# Patient Record
Sex: Male | Born: 1970 | Race: White | Hispanic: No | Smoking: Current every day smoker
Health system: Southern US, Community
[De-identification: ages and names within clinical notes are randomized; demographics above are authoritative.]

---

## 2005-11-03 ENCOUNTER — Emergency Department (HOSPITAL_COMMUNITY): Admission: EM | Admit: 2005-11-03 | Discharge: 2005-11-03 | Payer: Self-pay | Admitting: Emergency Medicine

## 2006-02-05 ENCOUNTER — Emergency Department (HOSPITAL_COMMUNITY): Admission: EM | Admit: 2006-02-05 | Discharge: 2006-02-05 | Payer: Self-pay | Admitting: Certified Registered"

## 2006-06-06 ENCOUNTER — Emergency Department (HOSPITAL_COMMUNITY): Admission: EM | Admit: 2006-06-06 | Discharge: 2006-06-06 | Payer: Self-pay | Admitting: Emergency Medicine

## 2010-09-13 ENCOUNTER — Emergency Department (HOSPITAL_COMMUNITY): Payer: Worker's Compensation

## 2010-09-13 ENCOUNTER — Emergency Department (HOSPITAL_COMMUNITY)
Admission: EM | Admit: 2010-09-13 | Discharge: 2010-09-13 | Disposition: A | Discharge status: Home / Self Care | Payer: Worker's Compensation | Attending: Emergency Medicine | Admitting: Emergency Medicine

## 2010-09-13 DIAGNOSIS — S6000XA Contusion of unspecified finger without damage to nail, initial encounter: Secondary | ICD-10-CM | POA: Insufficient documentation

## 2010-09-13 DIAGNOSIS — W19XXXA Unspecified fall, initial encounter: Secondary | ICD-10-CM | POA: Insufficient documentation

## 2010-09-13 DIAGNOSIS — M7989 Other specified soft tissue disorders: Secondary | ICD-10-CM | POA: Insufficient documentation

## 2010-09-13 DIAGNOSIS — M79609 Pain in unspecified limb: Secondary | ICD-10-CM | POA: Insufficient documentation

## 2010-09-13 DIAGNOSIS — IMO0002 Reserved for concepts with insufficient information to code with codable children: Secondary | ICD-10-CM | POA: Insufficient documentation

## 2010-09-13 DIAGNOSIS — F17200 Nicotine dependence, unspecified, uncomplicated: Secondary | ICD-10-CM | POA: Insufficient documentation

## 2010-09-13 DIAGNOSIS — S63639A Sprain of interphalangeal joint of unspecified finger, initial encounter: Secondary | ICD-10-CM | POA: Insufficient documentation

## 2011-06-25 ENCOUNTER — Encounter (HOSPITAL_COMMUNITY): Payer: Self-pay | Admitting: Emergency Medicine

## 2011-06-25 ENCOUNTER — Emergency Department (HOSPITAL_COMMUNITY)
Admission: EM | Admit: 2011-06-25 | Discharge: 2011-06-25 | Disposition: A | Discharge status: Home / Self Care | Payer: Self-pay | Attending: Emergency Medicine | Admitting: Emergency Medicine

## 2011-06-25 DIAGNOSIS — L0291 Cutaneous abscess, unspecified: Secondary | ICD-10-CM

## 2011-06-25 DIAGNOSIS — L0390 Cellulitis, unspecified: Secondary | ICD-10-CM

## 2011-06-25 DIAGNOSIS — L0231 Cutaneous abscess of buttock: Secondary | ICD-10-CM | POA: Insufficient documentation

## 2011-06-25 MED ORDER — OXYCODONE-ACETAMINOPHEN 5-325 MG PO TABS: 2.0000 | ORAL_TABLET | ORAL | Status: AC | PRN

## 2011-06-25 MED ORDER — SULFAMETHOXAZOLE-TRIMETHOPRIM 800-160 MG PO TABS: 1.0000 | ORAL_TABLET | Freq: Two times a day (BID) | ORAL | Status: AC

## 2011-06-25 MED ORDER — LIDOCAINE HCL 2 % IJ SOLN
10.0000 mL | Freq: Once | INTRAMUSCULAR | Status: AC
  Administered 2011-06-25: 20 mg via INTRADERMAL
  Filled 2011-06-25: qty 1

## 2011-06-25 MED ORDER — IBUPROFEN 800 MG PO TABS: 800.0000 mg | ORAL_TABLET | Freq: Three times a day (TID) | ORAL | Status: AC

## 2011-06-25 NOTE — ED Notes (Signed)
Pt reports swollen "boil" on l/buttock. Area painful with pressure

## 2011-06-25 NOTE — ED Provider Notes (Signed)
History     CSN: [id number]  Arrival date & time 06/25/11  1117   First MD Initiated Contact with Patient 06/25/11 1143      Chief Complaint  Patient presents with  . Sore    Absess on r/buttock. Painful x 2 days.    (Consider location/radiation/quality/duration/timing/severity/associated sxs/prior treatment) Patient is a 41 y.o. male presenting with abscess. The history is provided by the patient and a relative. No language interpreter was used.  Abscess  This is a new problem. The current episode started less than one week ago. The onset was gradual. The problem occurs occasionally. The problem has been rapidly worsening. The abscess is present on the left buttock. The problem is moderate. The abscess is characterized by redness, painfulness, draining and swelling. Pertinent negatives include no fever and no vomiting.  Reports intermittant abscess to L buttocks that has never been drained.  3cm in size with erythema and fluctuance.  No fever nausea or vomiting.   History reviewed. No pertinent past medical history.  History reviewed. No pertinent past surgical history.  Family History  Problem Relation Age of Onset  . Diabetes Mother   . Hypertension Mother     History  Substance Use Topics  . Smoking status: Current Everyday Smoker    Types: Cigarettes  . Smokeless tobacco: Not on file  . Alcohol Use: No      Review of Systems  Constitutional: Negative.  Negative for fever and diaphoresis.  HENT: Negative.   Eyes: Negative.   Respiratory: Negative.  Negative for shortness of breath.   Cardiovascular: Negative.   Gastrointestinal: Negative.  Negative for nausea and vomiting.  Skin:       L buttock tenderness with abscess  Neurological: Negative.  Negative for dizziness, weakness and light-headedness.  Psychiatric/Behavioral: Negative.   All other systems reviewed and are negative.    Allergies  Review of patient's allergies indicates no known  allergies.  Home Medications   Current Outpatient Rx  Name Route Sig Dispense Refill  . IBUPROFEN 800 MG PO TABS Oral Take 1 tablet (800 mg total) by mouth 3 (three) times daily. 21 tablet 0  . OXYCODONE-ACETAMINOPHEN 5-325 MG PO TABS Oral Take 2 tablets by mouth every 4 (four) hours as needed for pain. 15 tablet 0  . SULFAMETHOXAZOLE-TRIMETHOPRIM 800-160 MG PO TABS Oral Take 1 tablet by mouth 2 (two) times daily. 20 tablet 0    BP 112/89  Pulse 76  Temp(Src) 98.6 F (37 C) (Oral)  Resp 18  SpO2 98%  Physical Exam  Constitutional: He is oriented to person, place, and time. He appears well-developed and well-nourished. No distress.  HENT:  Head: Normocephalic.  Eyes: Pupils are equal, round, and reactive to light.  Neck: Normal range of motion.  Cardiovascular: Normal rate.   Pulmonary/Chest: Effort normal.  Neurological: He is alert and oriented to person, place, and time.  Skin: Skin is warm and dry.       L buttock 3cm abscess with cellulitis.  Tenderness erythema     ED Course  INCISION AND DRAINAGE Date/Time: 06/25/2011 12:16 PM Performed by: Remi Haggard Authorized by: Remi Haggard Consent: Verbal consent obtained. Written consent not obtained. Risks and benefits: risks, benefits and alternatives were discussed Consent given by: patient Patient understanding: patient states understanding of the procedure being performed Patient identity confirmed: verbally with patient, arm band, provided demographic data and hospital-assigned identification number Time out: Immediately prior to procedure a "time out" was called to verify the  correct patient, procedure, equipment, support staff and site/side marked as required. Type: abscess Anesthesia: local infiltration Local anesthetic: lidocaine 2% without epinephrine Anesthetic total: 6 ml Patient sedated: no Scalpel size: 11 Needle gauge: 20 Incision type: single straight Drainage: purulent and serosanguinous Drainage  amount: moderate Wound treatment: wound left open Packing material: 1/4 in iodoform gauze Patient tolerance: Patient tolerated the procedure well with no immediate complications.   (including critical care time)  Labs Reviewed - No data to display No results found.   1. Abscess   2. Cellulitis       MDM  Abscess/cellulitis  I & D to L buttocks. Return for packing removal and recheck in 46 hours.  Bactrim and percocet rx.          Remi Haggard, NP 06/25/11 2018

## 2011-06-25 NOTE — Discharge Instructions (Signed)
Ethan Rogers we drained an abscess on your L buttock today.  Start the antibiotics today.  Return in 48 hours for a recheck.  Use hot compresses 3 times a day and squeeze.  Return sooner for severe  Pain or high fever. Take ibuprofen 800mg  every 6 hours with food for pain.  For severe pain take percocet 1-2 tabs every 4 hours.   Abscess An abscess (boil or furuncle) is an infected area under your skin. This area is filled with yellowish white fluid (pus). HOME CARE   Only take medicine as told by your doctor.   Keep the skin clean around your abscess. Keep clothes that may touch the abscess clean.   Change any bandages (dressings) as told by your doctor.   Avoid direct skin contact with other people. The infection can spread by skin contact with others.   Practice good hygiene and do not share personal care items.   Do not share athletic equipment, towels, or whirlpools. Shower after every practice or work out session.   If a draining area cannot be covered:   Do not play sports.   Children should not go to daycare until the wound has healed or until fluid (drainage) stops coming out of the wound.   See your doctor for a follow-up visit as told.  GET HELP RIGHT AWAY IF:   There is more pain, puffiness (swelling), and redness in the wound site.   There is fluid or bleeding from the wound site.   You have muscle aches, chills, fever, or feel sick.   You or your child has a temperature by mouth above 102 F (38.9 C), not controlled by medicine.   Your baby is older than 3 months with a rectal temperature of 102 F (38.9 C) or higher.  MAKE SURE YOU:   Understand these instructions.   Will watch your condition.   Will get help right away if you are not doing well or get worse.  Document Released: 07/11/2007 Document Revised: 01/11/2011 Document Reviewed: 07/11/2007 [facility] Patient Information 2012 [place], [place].Cellulitis Cellulitis is an infection of the tissue under the  skin. The infected area is usually red and tender. This is caused by germs. These germs enter the body through cuts or sores. This usually happens in the arms or lower legs. HOME CARE   Take your medicine as told. Finish it even if you start to feel better.   If the infection is on the arm or leg, keep it raised (elevated).   Use a warm cloth on the infected area several times a day.   See your doctor for a follow-up visit as told.  GET HELP RIGHT AWAY IF:   You are tired or confused.   You throw up (vomit).   You have watery poop (diarrhea).   You feel ill and have muscle aches.   You have a fever.  MAKE SURE YOU:   Understand these instructions.   Will watch your condition.   Will get help right away if you are not doing well or get worse.  Document Released: 07/11/2007 Document Revised: 01/11/2011 Document Reviewed: 12/24/2008 [facility] Patient Information 2012 [place], [place].

## 2011-06-26 NOTE — ED Provider Notes (Signed)
Medical screening examination/treatment/procedure(s) were performed by non-physician practitioner and as supervising physician I was immediately available for consultation/collaboration.   Demoni Parmar M Kayna Suppa, MD 06/26/11 1322 

## 2020-06-02 ENCOUNTER — Emergency Department (HOSPITAL_BASED_OUTPATIENT_CLINIC_OR_DEPARTMENT_OTHER): Payer: Self-pay

## 2020-06-02 ENCOUNTER — Other Ambulatory Visit (HOSPITAL_BASED_OUTPATIENT_CLINIC_OR_DEPARTMENT_OTHER): Payer: Self-pay

## 2020-06-02 ENCOUNTER — Emergency Department (HOSPITAL_BASED_OUTPATIENT_CLINIC_OR_DEPARTMENT_OTHER)
Admission: EM | Admit: 2020-06-02 | Discharge: 2020-06-02 | Disposition: A | Discharge status: Home / Self Care | Payer: Self-pay | Attending: Emergency Medicine | Admitting: Emergency Medicine

## 2020-06-02 ENCOUNTER — Other Ambulatory Visit: Payer: Self-pay

## 2020-06-02 ENCOUNTER — Encounter (HOSPITAL_BASED_OUTPATIENT_CLINIC_OR_DEPARTMENT_OTHER): Payer: Self-pay | Admitting: *Deleted

## 2020-06-02 DIAGNOSIS — F17210 Nicotine dependence, cigarettes, uncomplicated: Secondary | ICD-10-CM | POA: Insufficient documentation

## 2020-06-02 DIAGNOSIS — S52572A Other intraarticular fracture of lower end of left radius, initial encounter for closed fracture: Secondary | ICD-10-CM | POA: Insufficient documentation

## 2020-06-02 DIAGNOSIS — W1830XA Fall on same level, unspecified, initial encounter: Secondary | ICD-10-CM | POA: Insufficient documentation

## 2020-06-02 DIAGNOSIS — Y990 Civilian activity done for income or pay: Secondary | ICD-10-CM | POA: Insufficient documentation

## 2020-06-02 MED ORDER — OXYCODONE-ACETAMINOPHEN 5-325 MG PO TABS
1.0000 | ORAL_TABLET | Freq: Once | ORAL | Status: AC
  Administered 2020-06-02: 1 via ORAL
  Filled 2020-06-02: qty 1

## 2020-06-02 MED ORDER — OXYCODONE-ACETAMINOPHEN 5-325 MG PO TABS
1.0000 | ORAL_TABLET | Freq: Four times a day (QID) | ORAL | 0 refills | Status: AC | PRN
  Filled 2020-06-02: qty 15, 4d supply, fill #0

## 2020-06-02 MED ORDER — IBUPROFEN 400 MG PO TABS
400.0000 mg | ORAL_TABLET | Freq: Once | ORAL | Status: AC
  Administered 2020-06-02: 400 mg via ORAL
  Filled 2020-06-02: qty 1

## 2020-06-02 NOTE — ED Provider Notes (Signed)
[facility] EMERGENCY DEPARTMENT Provider Note   CSN: [id number] Arrival date & time: 06/02/20  1338     History Chief Complaint  Patient presents with  . Arm Pain    Ethan Rogers is a 50 y.o. male who presents to the ED today with complaints of sudden onset, constant, sharp, left wrist pain s/p fall onto outstretched hand that occurred yesterday.  Patient reports he was at work when an object came and was swinging in front of him.  He states he lost his balance at this point and fell causing him to land on his left outstretched wrist.  He states he felt immediate pain to the wrist itself.  Denies head injury or loss of consciousness.  Patient states that since that time he has had pain and swelling with decreased range of motion due to swelling.  He has taken over-the-counter medications with mild relief.  He has no other complaints at this time.  Denies any previous injury to the wrist itself.  Denies weakness, numbness, tingling.   The history is provided by the patient and medical records.       History reviewed. No pertinent past medical history.  There are no problems to display for this patient.   History reviewed. No pertinent surgical history.     Family History  Problem Relation Age of Onset  . Diabetes Mother   . Hypertension Mother     Social History   Tobacco Use  . Smoking status: Current Every Day Smoker    Types: Cigarettes  . Smokeless tobacco: Never Used  Substance Use Topics  . Alcohol use: No  . Drug use: Never    Home Medications Prior to Admission medications   Medication Sig Start Date End Date Taking? Authorizing Provider  oxyCODONE-acetaminophen (PERCOCET/ROXICET) 5-325 MG tablet Take 1 tablet by mouth every 6 (six) hours as needed for severe pain. 06/02/20  Yes Hyman Hopes, Tyauna Lacaze, PA-C    Allergies    Patient has no known allergies.  Review of Systems   Review of Systems  Constitutional: Negative for chills and fever.   Musculoskeletal: Positive for arthralgias and joint swelling.  Skin: Negative for wound.  Neurological: Negative for weakness and numbness.  All other systems reviewed and are negative.   Physical Exam Updated Vital Signs BP (!) 148/106 (BP Location: Right Arm)   Pulse 97   Temp 98.1 F (36.7 C) (Oral)   Resp 18   Ht 5\' 10"  (1.778 m)   Wt 82.3 kg   SpO2 99%   BMI 26.04 kg/m   Physical Exam Vitals and nursing note reviewed.  Constitutional:      Appearance: He is not ill-appearing.  HENT:     Head: Normocephalic and atraumatic.  Eyes:     Conjunctiva/sclera: Conjunctivae normal.  Cardiovascular:     Rate and Rhythm: Normal rate and regular rhythm.     Pulses: Normal pulses.  Pulmonary:     Effort: Pulmonary effort is normal.     Breath sounds: Normal breath sounds. No wheezing, rhonchi or rales.  Musculoskeletal:     Comments: Diffuse swelling noted to left wrist with TTP along the distal radial aspect. ROM limited s/2 pain. Able to wiggle all fingers without difficulty. Cap refill < 2 seconds. Sensation intact. 2+ radial pulse.   Skin:    General: Skin is warm and dry.     Coloration: Skin is not jaundiced.  Neurological:     Mental Status: He is alert.  ED Results / Procedures / Treatments   Labs (all labs ordered are listed, but only abnormal results are displayed) Labs Reviewed - No data to display  EKG None  Radiology DG Wrist Complete Left  Result Date: 06/02/2020 CLINICAL DATA:  Pain following fall EXAM: LEFT WRIST - COMPLETE 3+ VIEW COMPARISON:  Left hand radiographs November 03, 2005 FINDINGS: Frontal, oblique, lateral, and ulnar deviation scaphoid images were obtained. There is a comminuted fracture of the distal radial metaphysis with several mildly displaced fracture fragments dorsally. A fracture fragment extends into the medial aspect of the distal radiocarpal joint. There is avulsion of the ulnar styloid. No dislocation. The joint spaces  appear normal. No erosive change. IMPRESSION: 1. Comminuted fracture distal radial metaphysis with a fracture fragment extending into the medial aspect of the radiocarpal joint. Several mildly displaced fracture fragments dorsally noted. 2.  Avulsion ulnar styloid. 3.  No demonstrable dislocation. 4.  No evident arthropathy. Electronically Signed   By: Bretta Bang III M.D.   On: 06/02/2020 14:33    Procedures Procedures   Medications Ordered in ED Medications  oxyCODONE-acetaminophen (PERCOCET/ROXICET) 5-325 MG per tablet 1 tablet (has no administration in time range)  ibuprofen (ADVIL) tablet 400 mg (400 mg Oral Given 06/02/20 1357)    ED Course  I have reviewed the triage vital signs and the nursing notes.  Pertinent labs & imaging results that were available during my care of the patient were reviewed by me and considered in my medical decision making (see chart for details).  Clinical Course as of 06/02/20 1606  Thu Jun 02, 2020  1524 [place]  [MV]    Clinical Course User Index [MV] Tanda Rockers, [place]   MDM Rules/Calculators/A&P                          50 year old male who presents to the ED today with complaint of left wrist pain secondary to fall on outstretched wrist yesterday.  Arrival to the ED vitals are stable.  Patient had an x-ray was done which does show a comminuted distal radius metaphysis fracture with fracture extending into the medial aspect of the joint.  He also severely mildly displaced fracture fragments dorsally.  On exam he has diffuse swelling and tenderness with patient along the radial aspect.  His range of motion is limited secondary to pain.  He is neurovascularly intact.  Given extension into the joint will plan to consult hand surgery for further recommendations.  Last ate a biscuit around 11 AM this morning.  He has been drinking Coke.   Discussed case with Dr. Merlyn Lot who recommends sugartong splint and outpatient follow up. Pt in agreement  with plan and stable for discharge.   This note was prepared using Dragon voice recognition software and may include unintentional dictation errors due to the inherent limitations of voice recognition software.  Final Clinical Impression(s) / ED Diagnoses Final diagnoses:  Other closed intra-articular fracture of distal end of left radius, initial encounter    Rx / DC Orders ED Discharge Orders         Ordered    oxyCODONE-acetaminophen (PERCOCET/ROXICET) 5-325 MG tablet  Every 6 hours PRN        06/02/20 1548           Discharge Instructions     Please call Dr. Merrilee Seashore office to schedule an appointment (call today or tomorrow for an appointment early next week).  Do not get splint wet  until you can be seen by Dr. Merlyn Lot  While at home please elevate your wrist to help with swelling. I have prescribed a course of narcotic pain medication to take as needed for severe pain.   Return to the ED for any worsening symptoms       Tanda Rockers, PA-C 06/02/20 1607    Tegeler, Canary Brim, MD 06/03/20 [phone]

## 2020-06-02 NOTE — ED Notes (Signed)
Pt. Reports no dizziness or LOC yesterday when he fell and last ate a biscuit this morning.  Pt. Also had a small amt of coke to drink today.

## 2020-06-02 NOTE — ED Triage Notes (Signed)
He lost his balance and fell yesterday. Injury to his left wrist. Swelling and pain.

## 2020-06-02 NOTE — ED Notes (Signed)
ED Provider at bedside. 

## 2020-06-02 NOTE — Discharge Instructions (Addendum)
Please call Dr. Merrilee Seashore office to schedule an appointment (call today or tomorrow for an appointment early next week).  Do not get splint wet until you can be seen by Dr. Merlyn Lot  While at home please elevate your wrist to help with swelling. I have prescribed a course of narcotic pain medication to take as needed for severe pain.   Return to the ED for any worsening symptoms

## 2020-06-02 NOTE — ED Notes (Signed)
Pt. Took BC powder this am

## 2022-10-25 IMAGING — DX DG WRIST COMPLETE 3+V*L*
4 series · 4 of 4 positions shown · non-contrast
Comparison: Left hand radiographs November 03, 2005

CLINICAL DATA: Pain following fall

EXAM:
LEFT WRIST - COMPLETE 3+ VIEW

[wrist pa]
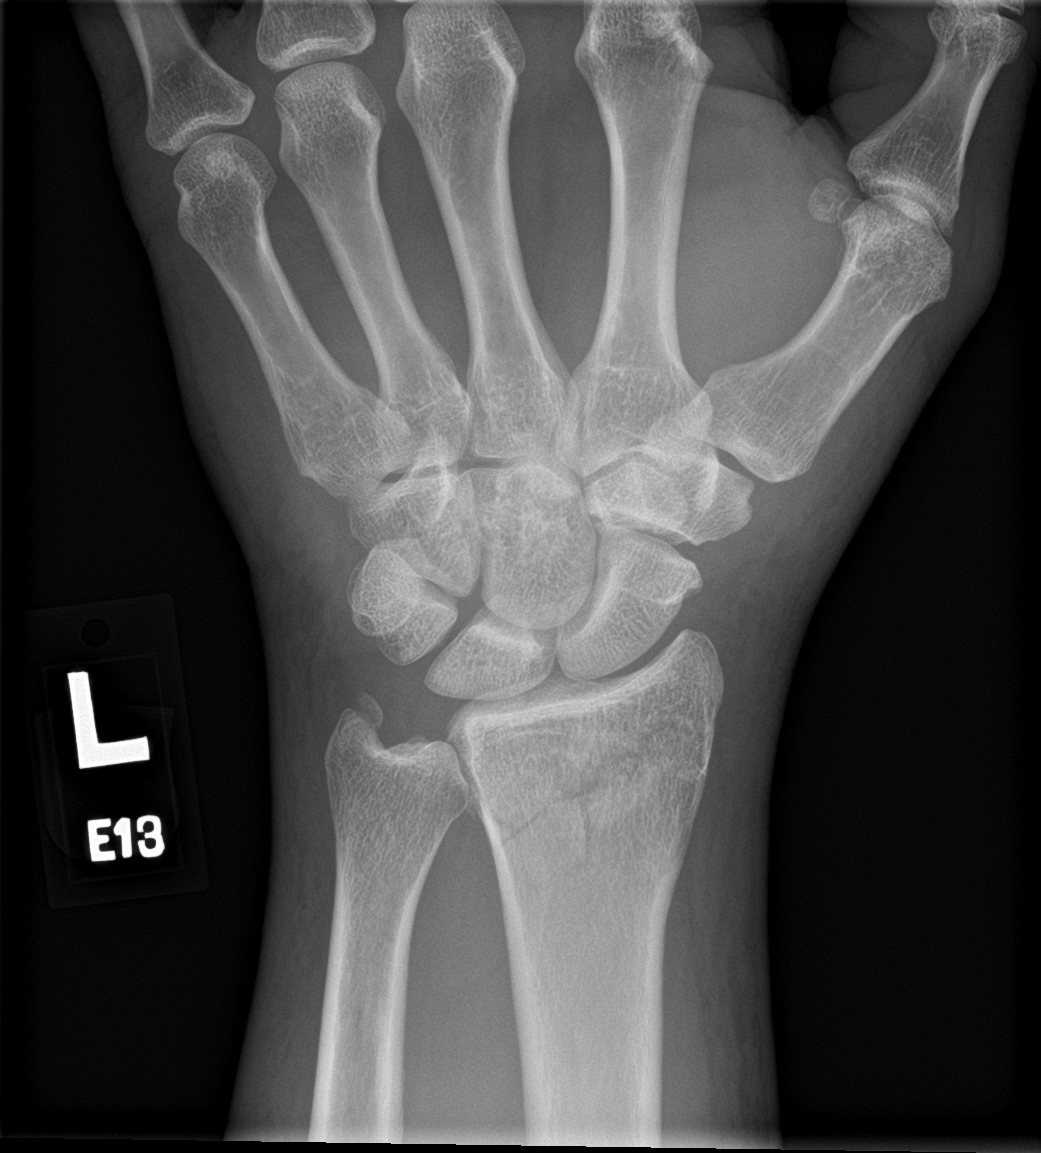

[wrist obl]
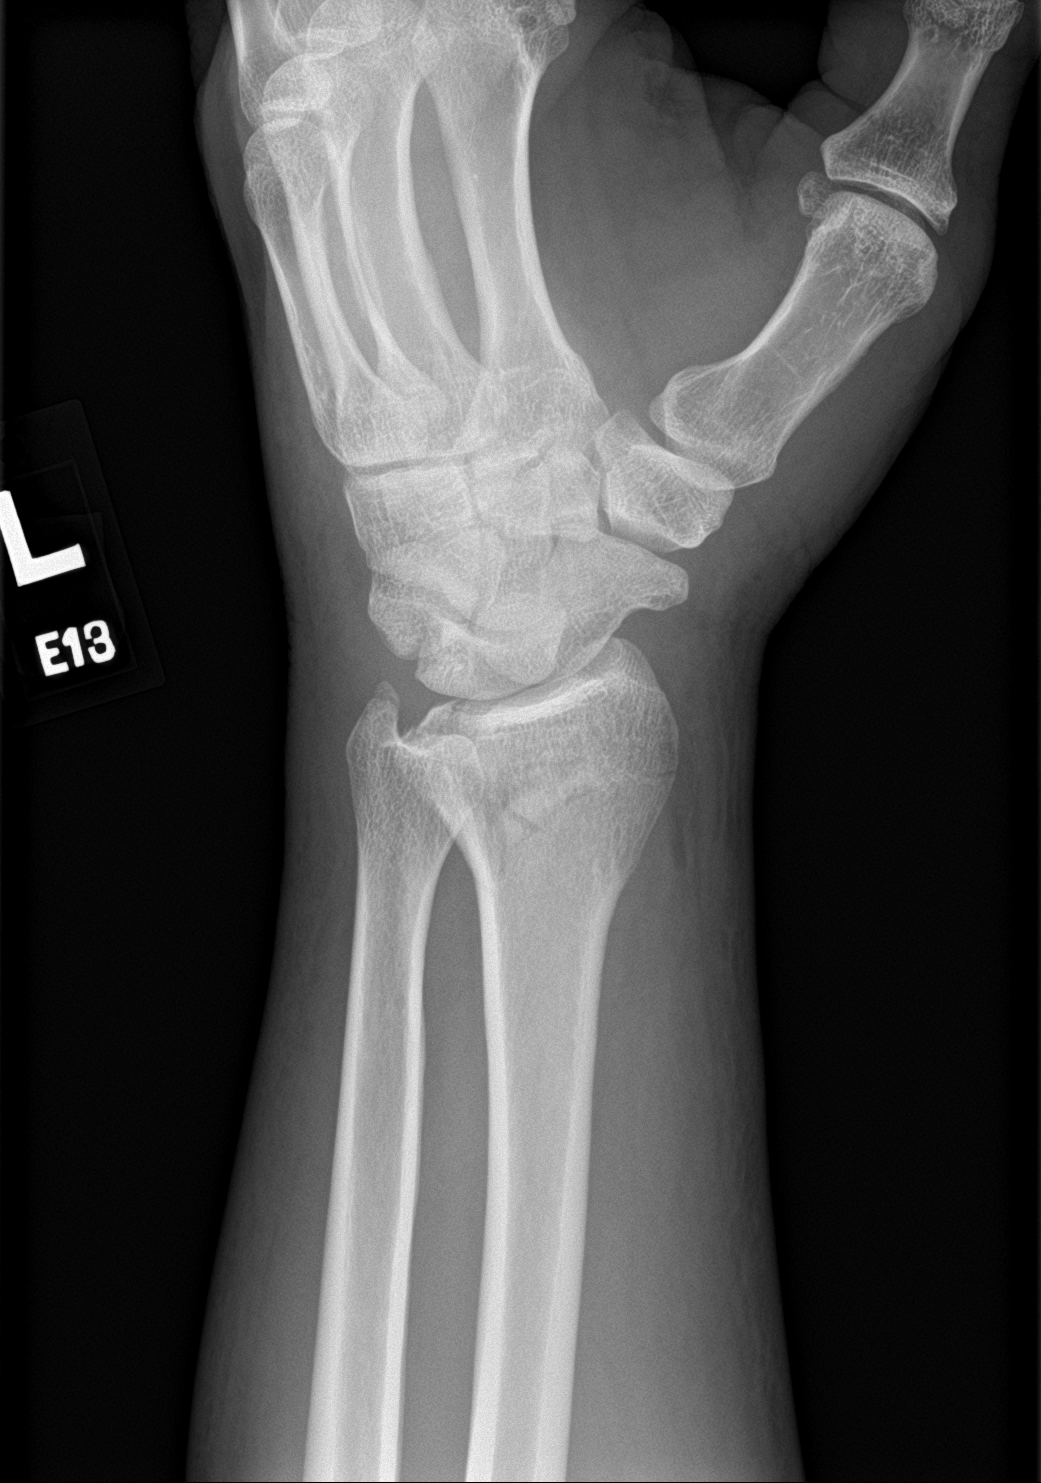

[wrist lat]
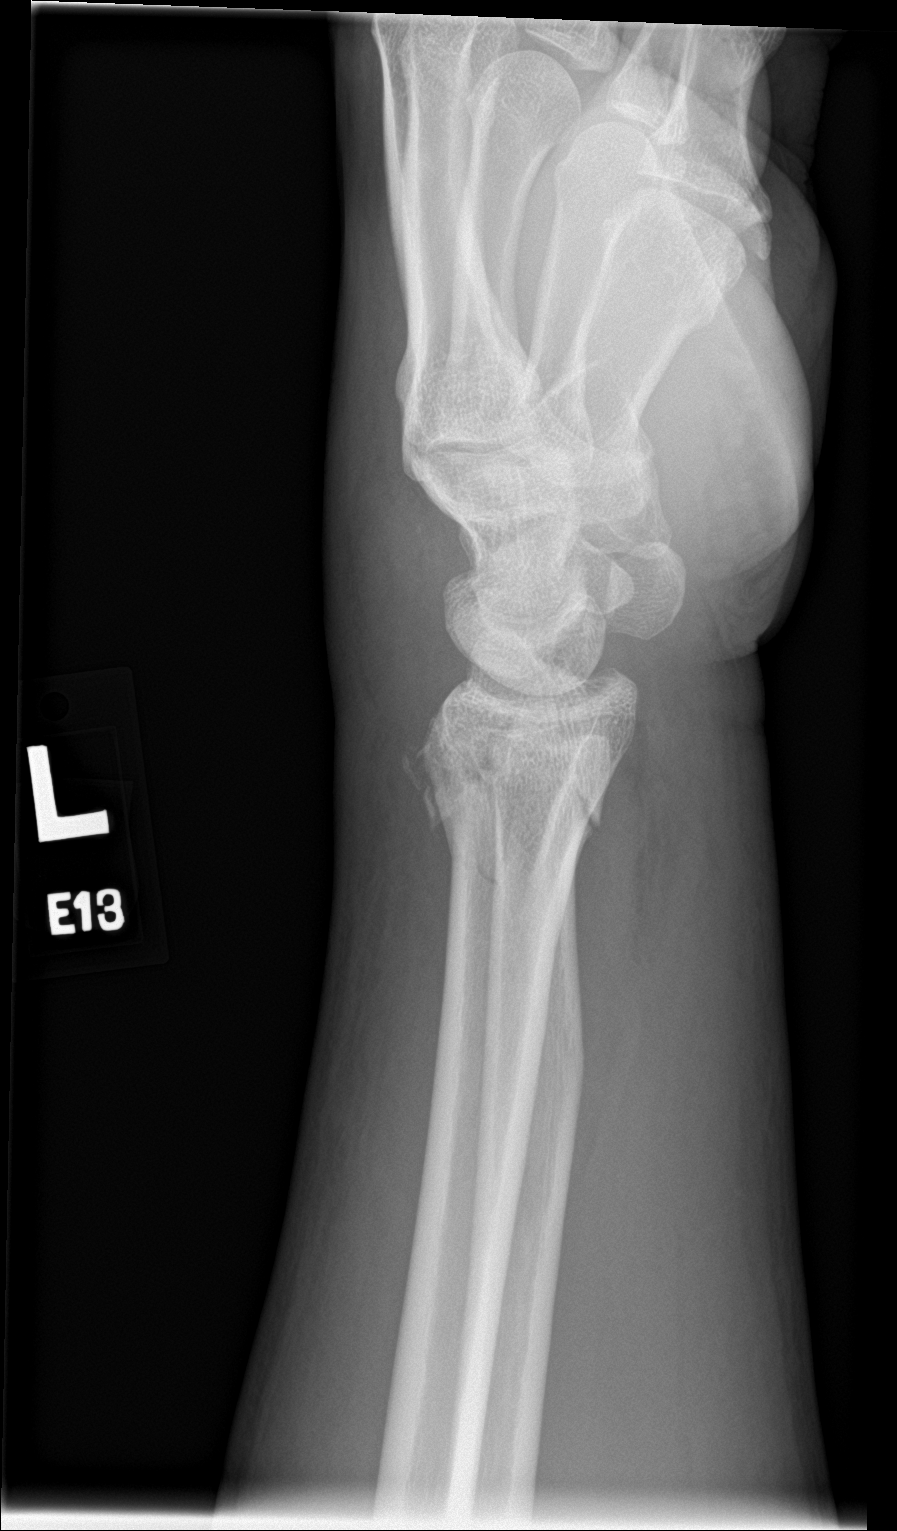

[wrist navicular]
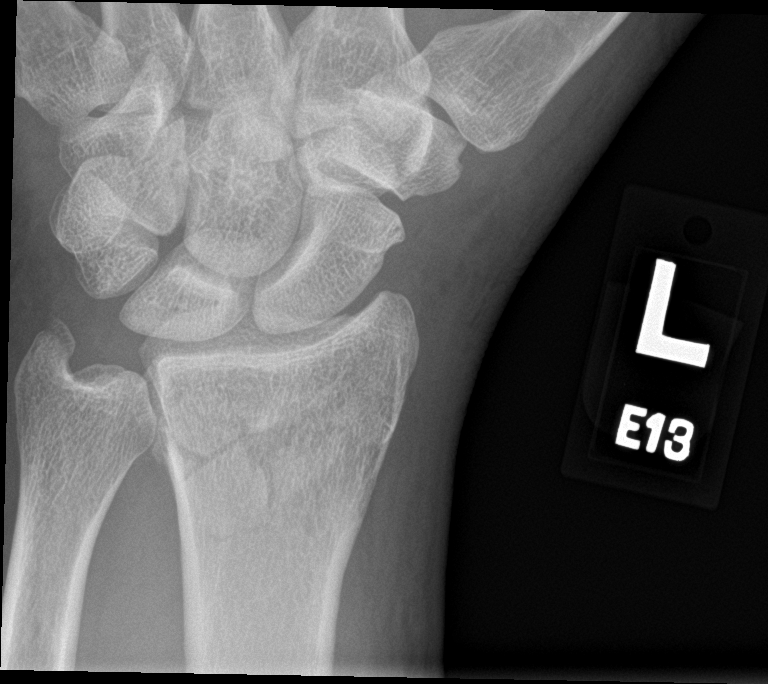

[4 of 4 positions shown; findings below may reference images not displayed]

FINDINGS: Frontal, oblique, lateral, and ulnar deviation scaphoid images were
obtained. There is a comminuted fracture of the distal radial
metaphysis with several mildly displaced fracture fragments
dorsally. A fracture fragment extends into the medial aspect of the
distal radiocarpal joint. There is avulsion of the ulnar styloid. No
dislocation. The joint spaces appear normal. No erosive change.
IMPRESSION: 1. Comminuted fracture distal radial metaphysis with a fracture
fragment extending into the medial aspect of the radiocarpal joint.
Several mildly displaced fracture fragments dorsally noted.

2.  Avulsion ulnar styloid.

3.  No demonstrable dislocation.

4.  No evident arthropathy.

## 2022-11-14 ENCOUNTER — Other Ambulatory Visit (HOSPITAL_COMMUNITY): Payer: Self-pay
